# Patient Record
Sex: Female | Born: 1970 | Race: Black or African American | Hispanic: No | Marital: Married | Smoking: Current every day smoker
Health system: Southern US, Community
[De-identification: ages and names within clinical notes are randomized; demographics above are authoritative.]

---

## 1999-12-21 ENCOUNTER — Encounter: Admission: RE | Admit: 1999-12-21 | Discharge: 1999-12-21 | Payer: Self-pay | Admitting: Internal Medicine

## 2000-04-23 ENCOUNTER — Encounter: Payer: Self-pay | Admitting: Emergency Medicine

## 2000-04-23 ENCOUNTER — Emergency Department (HOSPITAL_COMMUNITY): Admission: EM | Admit: 2000-04-23 | Discharge: 2000-04-23 | Payer: Self-pay | Admitting: Emergency Medicine

## 2001-04-24 ENCOUNTER — Emergency Department (HOSPITAL_COMMUNITY): Admission: EM | Admit: 2001-04-24 | Discharge: 2001-04-24 | Payer: Self-pay | Admitting: Emergency Medicine

## 2001-04-29 ENCOUNTER — Encounter: Admission: RE | Admit: 2001-04-29 | Discharge: 2001-04-29 | Payer: Self-pay | Admitting: Obstetrics & Gynecology

## 2001-04-29 ENCOUNTER — Encounter (INDEPENDENT_AMBULATORY_CARE_PROVIDER_SITE_OTHER): Payer: Self-pay | Admitting: *Deleted

## 2001-05-14 ENCOUNTER — Emergency Department (HOSPITAL_COMMUNITY): Admission: EM | Admit: 2001-05-14 | Discharge: 2001-05-15 | Payer: Self-pay | Admitting: *Deleted

## 2001-07-13 ENCOUNTER — Inpatient Hospital Stay (HOSPITAL_COMMUNITY): Admission: AD | Admit: 2001-07-13 | Discharge: 2001-07-13 | Payer: Self-pay | Admitting: *Deleted

## 2001-07-14 ENCOUNTER — Encounter: Payer: Self-pay | Admitting: Obstetrics

## 2001-07-14 ENCOUNTER — Inpatient Hospital Stay (HOSPITAL_COMMUNITY): Admission: RE | Admit: 2001-07-14 | Discharge: 2001-07-14 | Payer: Self-pay | Admitting: Obstetrics

## 2001-10-07 ENCOUNTER — Inpatient Hospital Stay (HOSPITAL_COMMUNITY): Admission: AD | Admit: 2001-10-07 | Discharge: 2001-10-10 | Payer: Self-pay | Admitting: *Deleted

## 2001-10-07 ENCOUNTER — Encounter: Payer: Self-pay | Admitting: *Deleted

## 2001-11-16 ENCOUNTER — Encounter (INDEPENDENT_AMBULATORY_CARE_PROVIDER_SITE_OTHER): Payer: Self-pay | Admitting: Specialist

## 2001-11-16 ENCOUNTER — Inpatient Hospital Stay (HOSPITAL_COMMUNITY): Admission: AD | Admit: 2001-11-16 | Discharge: 2001-11-18 | Payer: Self-pay | Admitting: *Deleted

## 2002-01-09 ENCOUNTER — Emergency Department (HOSPITAL_COMMUNITY): Admission: EM | Admit: 2002-01-09 | Discharge: 2002-01-10 | Payer: Self-pay | Admitting: Emergency Medicine

## 2002-01-10 ENCOUNTER — Encounter: Payer: Self-pay | Admitting: Emergency Medicine

## 2003-03-03 ENCOUNTER — Encounter: Payer: Self-pay | Admitting: Emergency Medicine

## 2003-03-03 ENCOUNTER — Emergency Department (HOSPITAL_COMMUNITY): Admission: EM | Admit: 2003-03-03 | Discharge: 2003-03-03 | Payer: Self-pay | Admitting: Emergency Medicine

## 2010-01-22 ENCOUNTER — Emergency Department (HOSPITAL_COMMUNITY): Admission: EM | Admit: 2010-01-22 | Discharge: 2010-01-22 | Payer: Self-pay | Admitting: Emergency Medicine

## 2010-10-01 LAB — URINALYSIS, ROUTINE W REFLEX MICROSCOPIC
Bilirubin Urine: NEGATIVE
Glucose, UA: NEGATIVE mg/dL
Ketones, ur: NEGATIVE mg/dL
Nitrite: NEGATIVE
Protein, ur: NEGATIVE mg/dL
Specific Gravity, Urine: 1.013 (ref 1.005–1.030)
Urobilinogen, UA: 0.2 mg/dL (ref 0.0–1.0)
pH: 5.5 (ref 5.0–8.0)

## 2010-10-01 LAB — URINE MICROSCOPIC-ADD ON

## 2010-12-01 NOTE — Op Note (Signed)
[facility]  Patient:    Jessica Golden, Jessica Golden Visit Number: [id number] MRN: [id number]          Service Type: OBS Location: [id number] [id number] Attending Physician:  Michaelle Copas Dictated by:   Clement Husbands, M.D. Proc. Date: 11/17/01 Admit Date:  11/16/2001 Discharge Date: 11/18/2001                             Operative Report  PREOPERATIVE DIAGNOSIS:       Requested sterilization.  POSTOPERATIVE DIAGNOSIS:      Requested sterilization.  OPERATION:                    Postpartum bilateral tubal ligation with                               partial salpingectomies.  SURGEON:                      Clement Husbands, M.D.  ASSISTANT:                    Argentina Donovan, M.D.  ANESTHESIA:                   Epidural.  DESCRIPTION OF PROCEDURE:     Patient under satisfactory epidural anesthesia, supine position.  The abdomen was prepped and draped.  A small transverse infraumbilical skin incision was made.  It was carried down to the _____ fascia, which was transversely divided, and the peritoneal cavity was entered.  The fundal portion of the uterus as well as both fallopian tubes and ovaries were inspected and were normal.  Both tubes were then doubly ligated in their midportion with 0 plain catgut and the ligated segments excised.  The tubal "stumps" were coagulated.  Hemostasis was fine.  Specimens were sent for both the right and left side.  The anterior peritoneum was closed with a running 0 Vicryl suture, _____ fascia was closed with running 0 Vicryl suture.  Skin edges were approximated with a subcuticular 4-0 nylon suture.  Blood loss was negligible.  Sponge count was correct.  Marcaine 0.5% with epinephrine had been injected in the site prior to the opening incision.  The patient tolerated the procedure well, was returned to the recovery room in satisfactory condition. Dictated by:   Clement Husbands, M.D. Attending  Physician:  Michaelle Copas DD:  11/17/01 TD:  11/18/01 Job: [id number]

## 2015-02-16 ENCOUNTER — Encounter (HOSPITAL_COMMUNITY): Payer: Self-pay

## 2015-02-16 ENCOUNTER — Emergency Department (HOSPITAL_COMMUNITY)
Admission: EM | Admit: 2015-02-16 | Discharge: 2015-02-17 | Disposition: A | Discharge status: Home / Self Care | Payer: Self-pay | Attending: Emergency Medicine | Admitting: Emergency Medicine

## 2015-02-16 DIAGNOSIS — Z720 Tobacco use: Secondary | ICD-10-CM | POA: Insufficient documentation

## 2015-02-16 DIAGNOSIS — L259 Unspecified contact dermatitis, unspecified cause: Secondary | ICD-10-CM | POA: Insufficient documentation

## 2015-02-16 DIAGNOSIS — H9209 Otalgia, unspecified ear: Secondary | ICD-10-CM | POA: Insufficient documentation

## 2015-02-16 NOTE — ED Provider Notes (Signed)
CSN: [id number]     Arrival date & time 02/16/15  2250 History   This chart was scribed for Blake Divine, MD by Arlan Organ, ED Scribe. This patient was seen in room A01C/A01C and the patient's care was started 11:47 PM.   Chief Complaint  Patient presents with  . Rash   The history is provided by the patient. No language interpreter was used.    HPI Comments: Jessica Golden is a 44 y.o. female without any pertinent past medical history who presents to the Emergency Department complaining of a pruritis rash to the hair line x 3-4 days. Pt also reports swelling to a lymph node on the L side of her throat along with mild "drainage" from hairline and L sided otalgia. Ms. Soltero noted symptoms after using hair jam and Karenique in her hair. Last use of products 2-3 days ago. Water and Vinegar combination attempted to rash without any improvement for symptoms. Hair has also been washed several times since time of onset. No OTC medications attempted prior to arrival. No recent fever or chills. No known allergies to medications.  History reviewed. No pertinent past medical history. History reviewed. No pertinent past surgical history. No family history on file. History  Substance Use Topics  . Smoking status: Current Every Day Smoker  . Smokeless tobacco: Not on file  . Alcohol Use: No   OB History    No data available     Review of Systems  Constitutional: Negative for fever and chills.  HENT: Positive for ear pain. Negative for congestion, rhinorrhea and sore throat.   Respiratory: Negative for shortness of breath.   Cardiovascular: Negative for chest pain.  Gastrointestinal: Negative for nausea and vomiting.  Skin: Positive for rash.  Psychiatric/Behavioral: Negative for confusion.  All other systems reviewed and are negative.     Allergies  Review of patient's allergies indicates no known allergies.  Home Medications   Prior to Admission medications   Not on File   Triage  Vitals: BP 119/58 mmHg  Pulse 72  Resp 18  Ht  (1.727 m)  Wt 148 lb 9 oz (67.388 kg)  BMI 22.59 kg/m2  SpO2 100%  LMP 02/08/2015   Physical Exam  Constitutional: She is oriented to person, place, and time. She appears well-developed and well-nourished. No distress.  HENT:  Head: Normocephalic and atraumatic.    Eyes: Conjunctivae are normal. No scleral icterus.  Neck: Neck supple.  Cardiovascular: Normal rate and intact distal pulses.   Pulmonary/Chest: Effort normal. No stridor. No respiratory distress.  Abdominal: Normal appearance. She exhibits no distension.  Neurological: She is alert and oriented to person, place, and time.  Skin: Skin is warm and dry. No rash noted.  Scaly rash within anterior hairline and tops of ears.  No redness. No tenderness.  No masses.    Psychiatric: She has a normal mood and affect. Her behavior is normal.  Nursing note and vitals reviewed.   ED Course  Procedures (including critical care time)  DIAGNOSTIC STUDIES: Oxygen Saturation is 100% on RA, Normal by my interpretation.    COORDINATION OF CARE: 11:52 PM- Will send home with a prescription for Prednisone. Discussed treatment plan with pt at bedside and pt agreed to plan.     Labs Review Labs Reviewed - No data to display  Imaging Review No results found.   EKG Interpretation None      MDM   Final diagnoses:  Contact dermatitis    Contact  dermatitis after exposure to new hair products.  Does not appear to be bacterial or fungal infection.  Will treat with course of steroids.    I personally performed the services described in this documentation, which was scribed in my presence. The recorded information has been reviewed and is accurate.     Blake Divine, MD 02/17/15 [phone]

## 2015-02-16 NOTE — ED Notes (Signed)
Pt states she has been using jam and keranique for her hair for the past 3-4 days and hairline in the front is sticky. She has been using vinegar and water but it is still sticky. Also reports a swollen lymph node to the left throat also for the past few days. Tender with palpation. Noticed she has had some bumps to her right forearm and antecubital area that are itching also for the past month.

## 2015-02-17 MED ORDER — PREDNISONE 10 MG PO TABS: 50.0000 mg | ORAL_TABLET | Freq: Every day | ORAL | Status: DC

## 2015-02-17 MED ORDER — PREDNISONE 20 MG PO TABS
50.0000 mg | ORAL_TABLET | Freq: Once | ORAL | Status: AC
  Administered 2015-02-17: 50 mg via ORAL
  Filled 2015-02-17: qty 3

## 2015-02-17 NOTE — Discharge Instructions (Signed)

## 2016-07-01 ENCOUNTER — Emergency Department (HOSPITAL_COMMUNITY): Payer: Medicaid Other

## 2016-07-01 ENCOUNTER — Encounter (HOSPITAL_COMMUNITY): Payer: Self-pay | Admitting: Emergency Medicine

## 2016-07-01 ENCOUNTER — Emergency Department (HOSPITAL_COMMUNITY)
Admission: EM | Admit: 2016-07-01 | Discharge: 2016-07-01 | Disposition: A | Discharge status: Home / Self Care | Payer: Medicaid Other | Attending: Emergency Medicine | Admitting: Emergency Medicine

## 2016-07-01 DIAGNOSIS — R519 Headache, unspecified: Secondary | ICD-10-CM

## 2016-07-01 DIAGNOSIS — R51 Headache: Secondary | ICD-10-CM | POA: Diagnosis present

## 2016-07-01 DIAGNOSIS — F17200 Nicotine dependence, unspecified, uncomplicated: Secondary | ICD-10-CM | POA: Diagnosis not present

## 2016-07-01 DIAGNOSIS — R05 Cough: Secondary | ICD-10-CM | POA: Diagnosis not present

## 2016-07-01 DIAGNOSIS — R059 Cough, unspecified: Secondary | ICD-10-CM

## 2016-07-01 LAB — CBC WITH DIFFERENTIAL/PLATELET
BASOS ABS: 0 10*3/uL (ref 0.0–0.1)
Basophils Relative: 0 %
EOS ABS: 0.1 10*3/uL (ref 0.0–0.7)
EOS PCT: 1 %
HCT: 35.6 % — ABNORMAL LOW (ref 36.0–46.0)
Hemoglobin: 12 g/dL (ref 12.0–15.0)
Lymphocytes Relative: 39 %
Lymphs Abs: 2.8 10*3/uL (ref 0.7–4.0)
MCH: 29.9 pg (ref 26.0–34.0)
MCHC: 33.7 g/dL (ref 30.0–36.0)
MCV: 88.6 fL (ref 78.0–100.0)
Monocytes Absolute: 0.6 10*3/uL (ref 0.1–1.0)
Monocytes Relative: 8 %
Neutro Abs: 3.8 10*3/uL (ref 1.7–7.7)
Neutrophils Relative %: 52 %
PLATELETS: 292 10*3/uL (ref 150–400)
RBC: 4.02 MIL/uL (ref 3.87–5.11)
RDW: 13.1 % (ref 11.5–15.5)
WBC: 7.3 10*3/uL (ref 4.0–10.5)

## 2016-07-01 LAB — BASIC METABOLIC PANEL
Anion gap: 5 (ref 5–15)
BUN: 6 mg/dL (ref 6–20)
CO2: 23 mmol/L (ref 22–32)
CREATININE: 0.72 mg/dL (ref 0.44–1.00)
Calcium: 8.9 mg/dL (ref 8.9–10.3)
Chloride: 110 mmol/L (ref 101–111)
GFR calc Af Amer: >60 mL/min (ref >60)
Glucose, Bld: 88 mg/dL (ref 65–99)
POTASSIUM: 3.8 mmol/L (ref 3.5–5.1)
SODIUM: 138 mmol/L (ref 135–145)

## 2016-07-01 LAB — RAPID STREP SCREEN (MED CTR MEBANE ONLY): STREPTOCOCCUS, GROUP A SCREEN (DIRECT): NEGATIVE

## 2016-07-01 MED ORDER — METOCLOPRAMIDE HCL 5 MG/ML IJ SOLN
10.0000 mg | Freq: Once | INTRAMUSCULAR | Status: AC
  Administered 2016-07-01: 10 mg via INTRAVENOUS
  Filled 2016-07-01: qty 2

## 2016-07-01 MED ORDER — BENZONATATE 100 MG PO CAPS: 100.0000 mg | ORAL_CAPSULE | Freq: Three times a day (TID) | ORAL | 0 refills | Status: DC

## 2016-07-01 MED ORDER — DEXAMETHASONE SODIUM PHOSPHATE 10 MG/ML IJ SOLN
10.0000 mg | Freq: Once | INTRAMUSCULAR | Status: AC
  Administered 2016-07-01: 10 mg via INTRAVENOUS
  Filled 2016-07-01: qty 1

## 2016-07-01 MED ORDER — SODIUM CHLORIDE 0.9 % IV BOLUS (SEPSIS)
1000.0000 mL | Freq: Once | INTRAVENOUS | Status: AC
  Administered 2016-07-01: 1000 mL via INTRAVENOUS

## 2016-07-01 MED ORDER — KETOROLAC TROMETHAMINE 30 MG/ML IJ SOLN
30.0000 mg | Freq: Once | INTRAMUSCULAR | Status: AC
  Administered 2016-07-01: 30 mg via INTRAVENOUS
  Filled 2016-07-01: qty 1

## 2016-07-01 MED ORDER — BENZONATATE 100 MG PO CAPS
100.0000 mg | ORAL_CAPSULE | Freq: Once | ORAL | Status: AC
  Administered 2016-07-01: 100 mg via ORAL
  Filled 2016-07-01: qty 1

## 2016-07-01 MED ORDER — DIPHENHYDRAMINE HCL 50 MG/ML IJ SOLN
25.0000 mg | Freq: Once | INTRAMUSCULAR | Status: AC
  Administered 2016-07-01: 25 mg via INTRAVENOUS
  Filled 2016-07-01: qty 1

## 2016-07-01 NOTE — Discharge Instructions (Signed)
[facility] appointment [facility] if the lymph node on the left side is not getting better within the next month Avoid smoking/cut back as much as possible Return to the ED for worsening symptoms

## 2016-07-01 NOTE — ED Provider Notes (Signed)
[facility]-EMERGENCY DEPT Provider Note   CSN: [id number] Arrival date & time: 07/01/16  1118     History   Chief Complaint Chief Complaint  Patient presents with  . Cough  . Headache    HPI Jessica Golden is a 45 y.o. female who presents with a headache and cough. No significant PMH. She states she has had a cough for the past 3 weeks. She states she had URI symptoms 3 weeks ago which have resolved and now has had a cough which is productive at times. She also notes a "lump" on the right side of her face. Denies pain at that site.  She also started to have a headache several days ago. She states she has a history of tension headaches and this is similar to ones she has had in the past. The main difference it has lasted longer than previous headaches. She denies fever, chills, syncope, vision changes, ear pain, congestion, sore throat, chest pain, SOB, wheezing, abdominal pain, N/V/D/, dysuria. She is a current smoker.   HPI  History reviewed. No pertinent past medical history.  There are no active problems to display for this patient.   History reviewed. No pertinent surgical history.  OB History    No data available       Home Medications    Prior to Admission medications   Medication Sig Start Date End Date Taking? Authorizing Provider  predniSONE (DELTASONE) 10 MG tablet Take 5 tablets (50 mg total) by mouth daily. 02/17/15   Blake DivineJohn Wofford, MD    Family History History reviewed. No pertinent family history.  Social History Social History  Substance Use Topics  . Smoking status: Current Every Day Smoker  . Smokeless tobacco: Never Used  . Alcohol use No     Allergies   Patient has no known allergies.   Review of Systems Review of Systems  Constitutional: Negative for chills and fever.  HENT: Negative for congestion, ear pain, postnasal drip, rhinorrhea, sinus pressure and sore throat.        +Lymphadenopathy  Eyes: Negative for visual disturbance.    Respiratory: Positive for cough. Negative for shortness of breath and wheezing.   Cardiovascular: Negative for chest pain.  Gastrointestinal: Negative for abdominal pain, nausea and vomiting.  Genitourinary: Negative for dysuria.  Allergic/Immunologic: Negative for immunocompromised state.  Neurological: Positive for headaches. Negative for dizziness, syncope and light-headedness.  All other systems reviewed and are negative.    Physical Exam Updated Vital Signs BP 110/69   Pulse 71   Temp 99.2 F (37.3 C) (Oral)   Resp 18   SpO2 100%   Physical Exam  Constitutional: She is oriented to person, place, and time. She appears well-developed and well-nourished. No distress.  HENT:  Head: Normocephalic and atraumatic.  Right Ear: Hearing, tympanic membrane, external ear and ear canal normal.  Left Ear: Hearing, tympanic membrane, external ear and ear canal normal.  Nose: Nose normal. Right sinus exhibits no maxillary sinus tenderness and no frontal sinus tenderness. Left sinus exhibits no maxillary sinus tenderness and no frontal sinus tenderness.  Mouth/Throat: Uvula is midline, oropharynx is clear and moist and mucous membranes are normal. No dental caries.  Eyes: Conjunctivae are normal. Pupils are equal, round, and reactive to light. Right eye exhibits no discharge. Left eye exhibits no discharge. No scleral icterus.  Neck: Normal range of motion.  Cardiovascular: Normal rate and regular rhythm.  Exam reveals no gallop and no friction rub.   No murmur heard. Pulmonary/Chest:  Effort normal and breath sounds normal. No respiratory distress. She has no wheezes. She has no rales. She exhibits no tenderness.  Abdominal: She exhibits no distension.  Lymphadenopathy:    She has cervical adenopathy (Left tonsillar adenopathy).  Neurological: She is alert and oriented to person, place, and time.  Lying on stretcher in NAD. GCS 15. Speaks in a clear voice. Cranial nerves II through XII  grossly intact. 5/5 strength in all extremities. Sensation fully intact.  Bilateral finger-to-nose intact. Normal gait   Skin: Skin is warm and dry.  Psychiatric: She has a normal mood and affect. Her behavior is normal.  Nursing note and vitals reviewed.    ED Treatments / Results  Labs (all labs ordered are listed, but only abnormal results are displayed) Labs Reviewed  CBC WITH DIFFERENTIAL/PLATELET - Abnormal; Notable for the following:       Result Value   HCT 35.6 (*)    All other components within normal limits  RAPID STREP SCREEN (NOT AT [facility])  CULTURE, GROUP A STREP [facility])  BASIC METABOLIC PANEL    EKG  EKG Interpretation None       Radiology Dg Chest 2 View  Result Date: 07/01/2016 CLINICAL DATA:  Cough and congestion EXAM: CHEST  2 VIEW COMPARISON:  None. FINDINGS: No pneumothorax. The heart, hila, and mediastinum are normal. Calcified nodes are seen in the right hilum and mediastinum. No nodules or masses. No focal infiltrates. IMPRESSION: No active cardiopulmonary disease. Electronically Signed   By: Gerome Samavid  Williams III M.D   On: 07/01/2016 12:49    Procedures Procedures (including critical care time)  Medications Ordered in ED Medications  ketorolac (TORADOL) 30 MG/ML injection 30 mg (30 mg Intravenous Given 07/01/16 1350)  sodium chloride 0.9 % bolus 1,000 mL (0 mLs Intravenous Stopped 07/01/16 1543)  metoCLOPramide (REGLAN) injection 10 mg (10 mg Intravenous Given 07/01/16 1351)  diphenhydrAMINE (BENADRYL) injection 25 mg (25 mg Intravenous Given 07/01/16 1352)  dexamethasone (DECADRON) injection 10 mg (10 mg Intravenous Given 07/01/16 1354)  benzonatate (TESSALON) capsule 100 mg (100 mg Oral Given 07/01/16 1346)     Initial Impression / Assessment and Plan / ED Course  I have reviewed the triage vital signs and the nursing notes.  Pertinent labs & imaging results that were available during my care of the patient were reviewed by me and considered  in my medical decision making (see chart for details).  Clinical Course    45 year old female with residual cough from URI and headache. Patient is afebrile, not tachycardic or tachypneic, normotensive, and not hypoxic. CXR negative. Headache is mild in nature and typical for her usual headaches. Migraine cocktail given. Tessalon given.  On recheck, patient reports symptomatic improvement. Will d/c with Tessalon and advised smoking cessation. Patient is NAD, non-toxic, with stable VS. Patient is informed of clinical course, understands medical decision making process, and agrees with plan. Opportunity for questions provided and all questions answered. Return precautions given.   Final Clinical Impressions(s) / ED Diagnoses   Final diagnoses:  Nonintractable headache, unspecified chronicity pattern, unspecified headache type  Cough    New Prescriptions Discharge Medication List as of 07/01/2016  2:54 PM    START taking these medications   Details  benzonatate (TESSALON) 100 MG capsule Take 1 capsule (100 mg total) by mouth every 8 (eight) hours., Starting Sun 07/01/2016, Print         Bethel BornKelly Marie Rosalene Wardrop, PA-C 07/09/16 1149    Margarita Grizzleanielle Ray, MD 07/11/16 [phone]

## 2016-07-01 NOTE — ED Triage Notes (Signed)
Pt sts HA and cough; pt sts pain with cough and movement in chest

## 2016-07-03 LAB — CULTURE, GROUP A STREP (THRC)

## 2016-08-30 ENCOUNTER — Encounter (HOSPITAL_COMMUNITY): Payer: Self-pay | Admitting: *Deleted

## 2016-08-30 ENCOUNTER — Ambulatory Visit (HOSPITAL_COMMUNITY)
Admission: EM | Admit: 2016-08-30 | Discharge: 2016-08-30 | Disposition: A | Discharge status: Home / Self Care | Payer: Medicaid Other | Attending: Internal Medicine | Admitting: Internal Medicine

## 2016-08-30 DIAGNOSIS — S161XXA Strain of muscle, fascia and tendon at neck level, initial encounter: Secondary | ICD-10-CM | POA: Diagnosis not present

## 2016-08-30 DIAGNOSIS — N3001 Acute cystitis with hematuria: Secondary | ICD-10-CM

## 2016-08-30 LAB — POCT URINALYSIS DIP (DEVICE)
Glucose, UA: NEGATIVE mg/dL
Ketones, ur: NEGATIVE mg/dL
Nitrite: NEGATIVE
Protein, ur: >=300 mg/dL — AB
UROBILINOGEN UA: 0.2 mg/dL (ref 0.0–1.0)
pH: 5.5 (ref 5.0–8.0)

## 2016-08-30 MED ORDER — CEPHALEXIN 500 MG PO CAPS: 500.0000 mg | ORAL_CAPSULE | Freq: Four times a day (QID) | ORAL | 0 refills | Status: DC

## 2016-08-30 MED ORDER — FLUCONAZOLE 150 MG PO TABS: ORAL_TABLET | ORAL | 0 refills | Status: DC

## 2016-08-30 NOTE — ED Provider Notes (Signed)
CSN: [id number]     Arrival date & time 08/30/16  1518 History   First MD Initiated Contact with Patient 08/30/16 1731     Chief Complaint  Patient presents with  . Polyuria   (Consider location/radiation/quality/duration/timing/severity/associated sxs/prior Treatment) 46 year old female presents with frequent urination, urinary urgency and a sense of needing to 4 week immediately after voiding. Sometimes the amount of voiding is a small volume. Also with gross hematuria. Second complaint is that of waking up recently with sore muscles in the right neck. She states "I have got a crick in my neck". No known injury. Has pain when  she turns her head to the right. No spinal pain or tenderness.      History reviewed. No pertinent past medical history. History reviewed. No pertinent surgical history. History reviewed. No pertinent family history. Social History  Substance Use Topics  . Smoking status: Current Every Day Smoker  . Smokeless tobacco: Never Used  . Alcohol use No   OB History    No data available     Review of Systems  Constitutional: Negative for activity change, chills and fever.  HENT: Negative.   Respiratory: Negative.   Cardiovascular: Negative.   Genitourinary: Positive for dysuria, frequency and urgency. Negative for pelvic pain and vaginal discharge.  Musculoskeletal: Positive for myalgias and neck pain.       As per HPI  Skin: Negative for color change, pallor and rash.  Neurological: Negative.     Allergies  Patient has no known allergies.  Home Medications   Prior to Admission medications   Medication Sig Start Date End Date Taking? Authorizing Provider  benzonatate (TESSALON) 100 MG capsule Take 1 capsule (100 mg total) by mouth every 8 (eight) hours. 07/01/16   Bethel BornKelly Marie Gekas, PA-C  cephALEXin (KEFLEX) 500 MG capsule Take 1 capsule (500 mg total) by mouth 4 (four) times daily. 08/30/16   Hayden Rasmussenavid Inette Doubrava, NP  fluconazole (DIFLUCAN) 150 MG tablet 1 tab  po x 1. May repeat in 72 hours if no improvement 08/30/16   Hayden Rasmussenavid Michalle Rademaker, NP   Meds Ordered and Administered this Visit  Medications - No data to display  BP 113/71 (BP Location: Right Arm)   Pulse 77   Temp 98.4 F (36.9 C) (Oral)   Resp 17   LMP 08/26/2016   SpO2 100%  No data found.   Physical Exam  Constitutional: She is oriented to person, place, and time. She appears well-developed and well-nourished. No distress.  Eyes: EOM are normal.  Neck: Neck supple.  Cardiovascular: Normal rate.   Pulmonary/Chest: Effort normal. No respiratory distress.  Musculoskeletal: She exhibits no edema.  Tenderness to the right paracervical and right trapezius muscle. Limited rotation to the right secondary to muscle pain. No spinal tenderness.  Neurological: She is alert and oriented to person, place, and time. She exhibits normal muscle tone.  Skin: Skin is warm and dry.  Psychiatric: She has a normal mood and affect.  Nursing note and vitals reviewed.   Urgent Care Course     Procedures (including critical care time)  Labs Review Labs Reviewed  POCT URINALYSIS DIP (DEVICE) - Abnormal; Notable for the following:       Result Value   Bilirubin Urine SMALL (*)    Hgb urine dipstick LARGE (*)    Protein, ur >=300 (*)    Leukocytes, UA TRACE (*)    All other components within normal limits    Imaging Review No results found.  Visual Acuity Review  Right Eye Distance:   Left Eye Distance:   Bilateral Distance:    Right Eye Near:   Left Eye Near:    Bilateral Near:         MDM   1. Acute cystitis with hematuria   2. Cervical muscle strain, initial encounter    Apply heat to the muscles of your neck and perform gentle stretches. Drink any fluids stay well-hydrated. Take the medicine as directed. Meds ordered this encounter  Medications  . cephALEXin (KEFLEX) 500 MG capsule    Sig: Take 1 capsule (500 mg total) by mouth 4 (four) times daily.    Dispense:  28  capsule    Refill:  0    Order Specific Question:   Supervising Provider    Answer:   Eustace Moore [[id number]]  . fluconazole (DIFLUCAN) 150 MG tablet    Sig: 1 tab po x 1. May repeat in 72 hours if no improvement    Dispense:  2 tablet    Refill:  0    Order Specific Question:   Supervising Provider    Answer:   Eustace Moore [[id number]]       Hayden Rasmussen, NP 08/30/16 1745

## 2016-08-30 NOTE — ED Triage Notes (Addendum)
Patient reports about 3 weeks ago she went to dentist-was prescribed amoxicillin-states acquired yeast infection after that. States now she has had 3 day history of polyuria. Denies dysuria. Denies lower back pain or abdomen pain.   Patient also reports right upper back pain since this morning after sleeping

## 2016-08-30 NOTE — Discharge Instructions (Signed)
Apply heat to the muscles of your neck and perform gentle stretches. Drink any fluids stay well-hydrated. Take the medicine as directed.

## 2017-11-07 ENCOUNTER — Ambulatory Visit: Payer: Self-pay | Admitting: Surgery

## 2017-11-18 ENCOUNTER — Ambulatory Visit: Payer: Self-pay | Admitting: Surgery

## 2017-11-18 NOTE — H&P (Signed)
istory of Present Illness Jessica Golden. Tennile Styles MD; 11/18/2017 9:37 AM) The patient is a 47 year old female who presents with a complaint of Mass. Referred by Ladora Daniel PA-C for abdominal wall mass, and left neck mass   This is a 47 year old female who presents with more than 10 years of a slowly enlarging mass on the left side of her abdomen. This has become quite large and is causing some discomfort. It is larger than a tennis ball. This area has never become infected. She would like to have it removed.  Over the last 6 months, the patient has developed a smaller mass on the left side of her neck just posterior to her ear. This has also cause some discomfort. She denies any facial numbness or drooping. She had ultrasounds of both of these areas. The abdominal ultrasound showed that this area likely represents a lipoma. However the left neck mass is a septated cystic lesion associated with the left parotid gland. This measures 3.8 x 3.0 x 2.7 cm. we referred her to ENT who aspirated this area with resolution of the mass. They're going to follow her without surgery at this time.     Problem List/Past Medical Molli Hazard K. 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 Marie Chow, MD; 11/18/2017 9:37 AM) No pertinent past surgical history  Diagnostic Studies History (Ashlen Kiger K. Tra Wilemon, MD; 11/18/2017 9:37 AM) Mammogram within last year Pap Smear >5 years ago  Allergies (Tanisha A. Manson Passey, RMA; 11/18/2017 9:11 AM) No Known Drug Allergies [10/09/2017]: Allergies Reconciled  Medication History (Tanisha A. Manson Passey, RMA; 11/18/2017 9:11 AM) No Current Medications Medications Reconciled  Social History Jessica Golden. Manu Rubey, MD; 11/18/2017 9:37 AM) Alcohol use Occasional alcohol use. Caffeine use Coffee. Illicit drug use Uses socially only. Tobacco use Current every  day smoker.  Family History Jessica Golden. Messiah Ahr, MD; 11/18/2017 9:37 AM) First Degree Relatives No pertinent family history  Pregnancy / Birth History Jessica Golden. Bettie Swavely, MD; 11/18/2017 9:37 AM) Age at menarche 14 years. Gravida 3 Irregular periods Length (months) of breastfeeding 3-6 Maternal age 59-20 Para 3  Other Problems Jessica Golden. Lloyd Cullinan, MD; 11/18/2017 9:37 AM) No pertinent past medical history    Vitals (Tanisha A. Brown RMA; 11/18/2017 9:11 AM) 11/18/2017 9:11 AM Weight: 174.4 lb Height: 68in Body Surface Area: 1.93 m Body Mass Index: 26.52 kg/m  Temp.: 97.39F  Pulse: 88 (Regular)  BP: 128/84 (Sitting, Left Arm, Standard)      Physical Exam Molli Hazard K. Ryleigh Buenger MD; 11/18/2017 9:37 AM)  The physical exam findings are as follows: Note:Well-developed well-nourished in no apparent distress  Eyes: Pupils equal, round; sclera anicteric HENT: Oral mucosa moist; good dentition Neck: left neck posterior to ear - firm, smooth 2 cm mass - protruding, no sign of infection; no thyromegaly Lungs: CTA bilaterally; normal respiratory effort CV: Regular rate and rhythm; no murmurs; extremities well-perfused with no edema Abd: +bowel sounds, soft, non-tender, no palpable organomegaly; protruding 7 cm subcutaneous mass on left side of abdomen - smooth, mobile, no skin changes Protruding umbilical hernia - enlarges with Valsalva maneuver Skin: Warm, dry; no sign of jaundice Psychiatric - alert and oriented x 4; calm mood and affect    Assessment & Plan Molli Hazard K. 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 The surgical procedure has been discussed with the patient. Potential risks, benefits, alternative treatments, and expected outcomes have been explained. All of the  patient's questions at this time have been answered. The likelihood of reaching the patient's treatment goal is good. The patient understand the proposed surgical procedure and wishes to proceed.  Jessica Golden. Corliss Skains, MD, [facility]  General/ Trauma Surgery  11/18/2017 9:38 AM

## 2017-11-26 ENCOUNTER — Other Ambulatory Visit: Payer: Self-pay | Admitting: Surgery

## 2017-12-02 IMAGING — CR DG CHEST 2V
2 series · 2 of 2 positions shown · non-contrast
Comparison: None.

CLINICAL DATA: Cough and congestion

EXAM:
CHEST  2 VIEW

[chest pa]
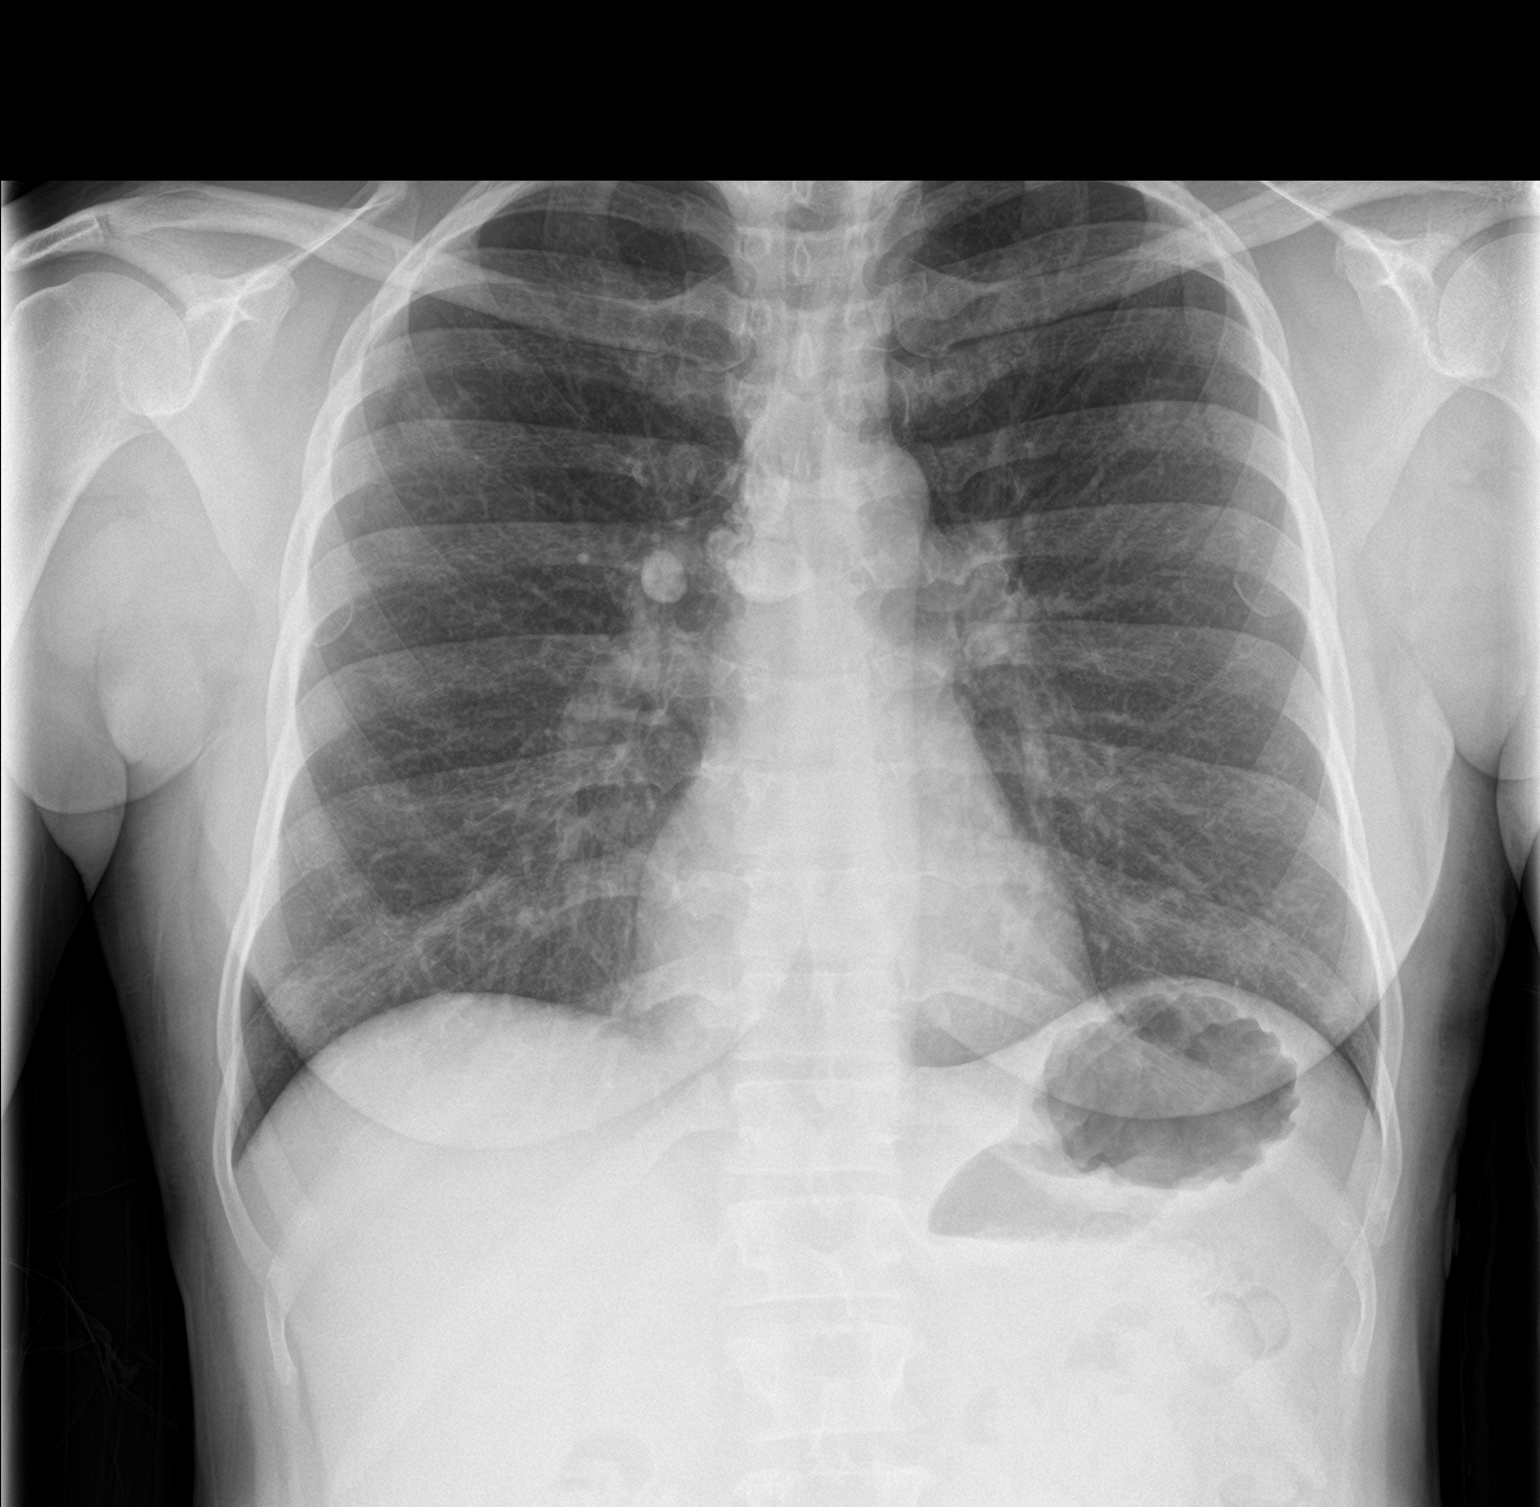

[chest lat]
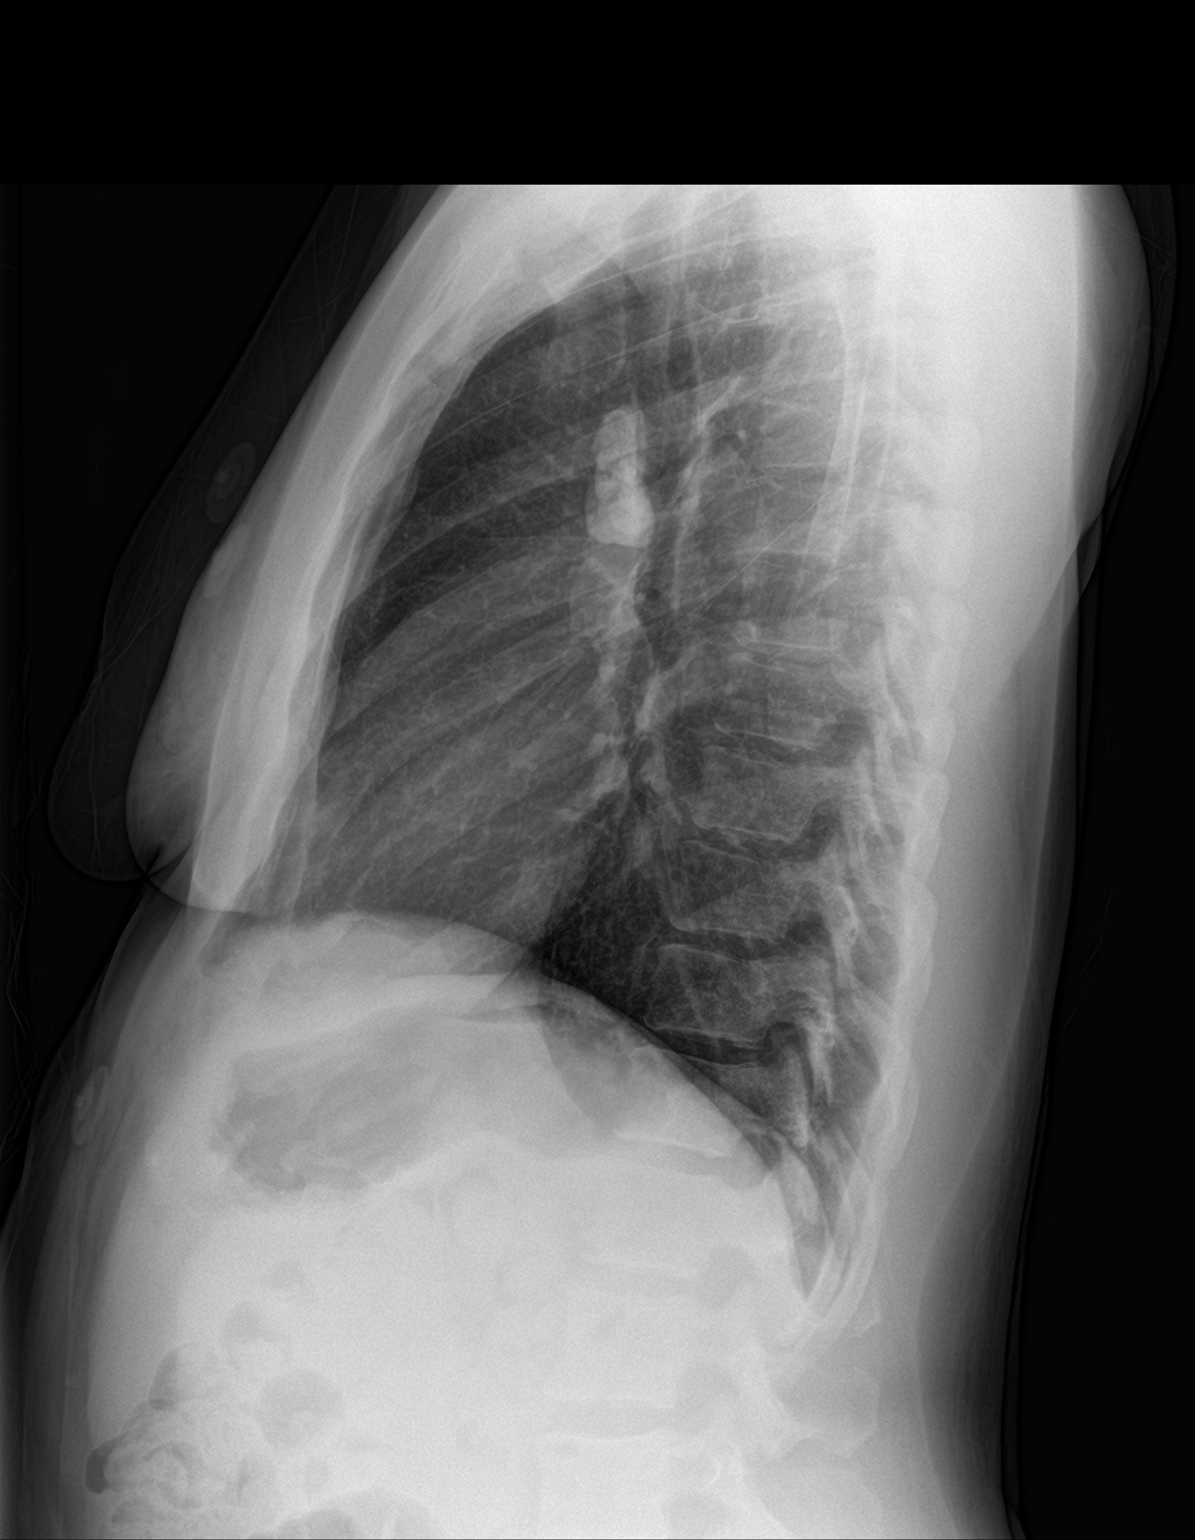

[2 of 2 positions shown; findings below may reference images not displayed]

FINDINGS: No pneumothorax. The heart, hila, and mediastinum are normal.
Calcified nodes are seen in the right hilum and mediastinum. No
nodules or masses. No focal infiltrates.
IMPRESSION: No active cardiopulmonary disease.

## 2020-05-08 ENCOUNTER — Emergency Department (HOSPITAL_COMMUNITY)
Admission: EM | Admit: 2020-05-08 | Discharge: 2020-05-08 | Disposition: A | Discharge status: Home / Self Care | Payer: No Typology Code available for payment source | Attending: Emergency Medicine | Admitting: Emergency Medicine

## 2020-05-08 ENCOUNTER — Other Ambulatory Visit: Payer: Self-pay

## 2020-05-08 ENCOUNTER — Encounter (HOSPITAL_COMMUNITY): Payer: Self-pay

## 2020-05-08 ENCOUNTER — Emergency Department (HOSPITAL_COMMUNITY): Payer: No Typology Code available for payment source

## 2020-05-08 DIAGNOSIS — W208XXA Other cause of strike by thrown, projected or falling object, initial encounter: Secondary | ICD-10-CM | POA: Diagnosis not present

## 2020-05-08 DIAGNOSIS — Y990 Civilian activity done for income or pay: Secondary | ICD-10-CM | POA: Insufficient documentation

## 2020-05-08 DIAGNOSIS — F17200 Nicotine dependence, unspecified, uncomplicated: Secondary | ICD-10-CM | POA: Diagnosis not present

## 2020-05-08 DIAGNOSIS — Y9289 Other specified places as the place of occurrence of the external cause: Secondary | ICD-10-CM | POA: Diagnosis not present

## 2020-05-08 DIAGNOSIS — M79672 Pain in left foot: Secondary | ICD-10-CM | POA: Insufficient documentation

## 2020-05-08 NOTE — ED Triage Notes (Signed)
Pt arrived via walk in, c/o left foot pain after dropping metal part on foot at work yesterday. Still able to ambulate and move toes, but painful.

## 2020-05-08 NOTE — Discharge Instructions (Addendum)
Take ibuprofen 3 times a day with meals.  Do not take other anti-inflammatories at the same time (Advil, Motrin, naproxen, Aleve). You may supplement with Tylenol if you need further pain control. Use ice packs or heating pads if this helps control your pain. Use crutches as needed for pain control.  Follow up with your primary care doctor, orthopedics, or the foot doctor as needed for further evaluation.  Return to the ER with any new, worsening, or concerning symptoms.

## 2020-05-08 NOTE — Progress Notes (Signed)
Orthopedic Tech Progress Note Patient Details:  Jessica Golden [birth year] [id number]  Ortho Devices Ortho Device/Splint Location: post op shoe LLE and crutches Ortho Device/Splint Interventions: Ordered, Application, Adjustment   Post Interventions Patient Tolerated: Well Instructions Provided: Care of device   Jennye Moccasin 05/08/2020, 1:21 PM

## 2020-05-08 NOTE — ED Provider Notes (Signed)
[facility]-EMERGENCY DEPT Provider Note   CSN: [id number] Arrival date & time: 05/08/20  1137     History Chief Complaint  Patient presents with  . Foot Injury    Jessica Golden is a 49 y.o. female presenting for evaluation of left foot pain.  Patient states yesterday she was at work when part of a car jack fell out of the packaging and fell onto her left foot.  She reports acute onset pain.  She elevated her foot and wrapped in an Ace wrap, but did not apply ice or take any medications.  She reports continued pain today.  No numbness or tingling.  No injury elsewhere.  She has no medical problems, takes medications daily.  Pain does not radiate.  Palpation and weightbearing makes it worse, nothing makes it better.  HPI     History reviewed. No pertinent past medical history.  There are no problems to display for this patient.   History reviewed. No pertinent surgical history.   OB History   No obstetric history on file.     History reviewed. No pertinent family history.  Social History   Tobacco Use  . Smoking status: Current Every Day Smoker  . Smokeless tobacco: Never Used  Substance Use Topics  . Alcohol use: No  . Drug use: No    Home Medications Prior to Admission medications   Medication Sig Start Date End Date Taking? Authorizing Provider  benzonatate (TESSALON) 100 MG capsule Take 1 capsule (100 mg total) by mouth every 8 (eight) hours. 07/01/16   Bethel Born, PA-C  cephALEXin (KEFLEX) 500 MG capsule Take 1 capsule (500 mg total) by mouth 4 (four) times daily. 08/30/16   Hayden Rasmussen, NP  fluconazole (DIFLUCAN) 150 MG tablet 1 tab po x 1. May repeat in 72 hours if no improvement 08/30/16   Hayden Rasmussen, NP    Allergies    Patient has no known allergies.  Review of Systems   Review of Systems  Musculoskeletal: Positive for arthralgias.  Neurological: Negative for numbness.    Physical Exam Updated Vital Signs BP 121/72  (BP Location: Left Arm)   Pulse 83   Temp 98.2 F (36.8 C) (Oral)   Resp 15   LMP 08/26/2016   SpO2 100%   Physical Exam Vitals and nursing note reviewed.  Constitutional:      General: She is not in acute distress.    Appearance: She is well-developed.  HENT:     Head: Normocephalic and atraumatic.  Pulmonary:     Effort: Pulmonary effort is normal.  Abdominal:     General: There is no distension.  Musculoskeletal:        General: Swelling and tenderness present. Normal range of motion.     Cervical back: Normal range of motion.     Comments: Mild swelling at the base of toes 2 through 4 of the left foot.  Tenderness palpation of the MTP and phalanxes in this location.  No tenderness palpation over the first tarsal, metatarsal, or toe.  Pedal pulse 2+ bilaterally.  Good distal sensation and cap refill of the left foot.  No pain of the ankle, Achilles, or calcaneus.  Skin:    General: Skin is warm.     Capillary Refill: Capillary refill takes less than 2 seconds.     Findings: No rash.  Neurological:     Mental Status: She is alert and oriented to person, place, and time.  ED Results / Procedures / Treatments   Labs (all labs ordered are listed, but only abnormal results are displayed) Labs Reviewed - No data to display  EKG None  Radiology DG Foot Complete Left  Result Date: 05/08/2020 CLINICAL DATA:  Pt reported left foot pain that radiates into all of her toes after dropping a car part on it yesterday at work.l foot pn EXAM: LEFT FOOT - COMPLETE 3+ VIEW COMPARISON:  None. FINDINGS: No fracture or dislocation of mid foot or forefoot. The phalanges are normal. The calcaneus is normal. No soft tissue abnormality. IMPRESSION: No fracture or dislocation. Electronically Signed   By: Genevive Bi M.D.   On: 05/08/2020 12:56    Procedures Procedures (including critical care time)  Medications Ordered in ED Medications - No data to display  ED Course  I have  reviewed the triage vital signs and the nursing notes.  Pertinent labs & imaging results that were available during my care of the patient were reviewed by me and considered in my medical decision making (see chart for details).    MDM Rules/Calculators/A&P                          Patient presenting for evaluation of left foot pain.  On exam, patient appears neurovascularly intact.  No obvious deformity.  However considering acute injury, will obtain x-rays.  X-ray viewed interpreted by me, no obvious fracture dislocation.  Discussed findings with patient.  Discussed Intermatic treatment Tylenol, ibuprofen, rest, ice, elevation.  Crutches and postop shoe given as needed for symptom control.  Encourage follow-up with orthopedics, podiatry, or primary care as needed.  At this time, patient appears safe for discharge.  Return precautions given.  Patient states she understands and agrees to plan.  Final Clinical Impression(s) / ED Diagnoses Final diagnoses:  Left foot pain    Rx / DC Orders ED Discharge Orders    None       Alveria Apley, PA-C 05/08/20 1401    Mancel Bale, MD 05/08/20 1415

## 2021-09-03 ENCOUNTER — Other Ambulatory Visit: Payer: Self-pay

## 2021-09-03 ENCOUNTER — Encounter (HOSPITAL_COMMUNITY): Payer: Self-pay

## 2021-09-03 ENCOUNTER — Ambulatory Visit (HOSPITAL_COMMUNITY)
Admission: EM | Admit: 2021-09-03 | Discharge: 2021-09-03 | Disposition: A | Discharge status: Home / Self Care | Payer: Medicaid Other | Attending: Urgent Care | Admitting: Urgent Care

## 2021-09-03 DIAGNOSIS — B351 Tinea unguium: Secondary | ICD-10-CM | POA: Diagnosis not present

## 2021-09-03 DIAGNOSIS — L301 Dyshidrosis [pompholyx]: Secondary | ICD-10-CM | POA: Diagnosis not present

## 2021-09-03 MED ORDER — TRIAMCINOLONE ACETONIDE 0.5 % EX OINT: 1.0000 "application " | TOPICAL_OINTMENT | Freq: Two times a day (BID) | CUTANEOUS | 0 refills | Status: DC

## 2021-09-03 NOTE — Discharge Instructions (Addendum)
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 This can be due to excessive handwashing or glove wearing. Please try to limit both if possible. Use the topical triamcinolone ointment twice daily.  Do not occlude and do not exceed greater than 14 days of use. Regarding your toenail, you can purchase over-the-counter ciclopirox which is an antifungal nail treatment.

## 2021-09-03 NOTE — ED Provider Notes (Signed)
[facility]    CSN: [id number] Arrival date & time: 09/03/21  1342      History   Chief Complaint Chief Complaint  Patient presents with   Rash    HPI Jessica Golden is a 51 y.o. female.   Pleasant 51 year old female presents today due to concerns of a rash on her bilateral hands x1 month.  She works for [employer] and states she wears gloves and washes her hands frequently.  She has been trying over-the-counter hydrocortisone, Monistat, Vaseline, and O'Keefe's working hands without any resolution to her symptoms.  She states the bumps are extremely itchy and the palms are occasionally painful.  She denies any rash anywhere Golden. She also is concerned with the appearance of her right great toe.  She states that a while back part of her nail fell off.  She is questioning if any treatment is needed.  She denies any pain to the toe.   Rash  History reviewed. No pertinent past medical history.  There are no problems to display for this patient.   History reviewed. No pertinent surgical history.  OB History   No obstetric history on file.      Home Medications    Prior to Admission medications   Medication Sig Start Date End Date Taking? Authorizing Provider  triamcinolone ointment (KENALOG) 0.5 % Apply 1 application topically 2 (two) times daily. 09/03/21  Yes Syanna Remmert, Jodelle Gross, PA    Family History Family History  Family history unknown: Yes    Social History Social History   Tobacco Use   Smoking status: Every Day   Smokeless tobacco: Never  Substance Use Topics   Alcohol use: No   Drug use: No     Allergies   Patient has no known allergies.   Review of Systems Review of Systems  Skin:  Positive for rash.  All other systems reviewed and are negative.   Physical Exam Triage Vital Signs ED Triage Vitals  Enc Vitals Group     BP 09/03/21 1520 109/74     Pulse Rate 09/03/21 1520 78     Resp 09/03/21 1520 18     Temp 09/03/21  1520 98 F (36.7 C)     Temp Source 09/03/21 1520 Oral     SpO2 09/03/21 1520 100 %     Weight --      Height --      Head Circumference --      Peak Flow --      Pain Score 09/03/21 1521 0     Pain Loc --      Pain Edu? --      Excl. in [place]? --    No data found.  Updated Vital Signs BP 109/74 (BP Location: Right Arm)    Pulse 78    Temp 98 F (36.7 C) (Oral)    Resp 18    LMP 08/26/2016    SpO2 100%   Visual Acuity Right Eye Distance:   Left Eye Distance:   Bilateral Distance:    Right Eye Near:   Left Eye Near:    Bilateral Near:     Physical Exam Vitals and nursing note reviewed.  Constitutional:      General: She is not in acute distress.    Appearance: Normal appearance. She is well-developed and normal weight. She is not ill-appearing or toxic-appearing.  HENT:     Head: Normocephalic and atraumatic.     Nose: Nose normal.  Mouth/Throat:     Mouth: Mucous membranes are moist.  Eyes:     Extraocular Movements: Extraocular movements intact.     Conjunctiva/sclera: Conjunctivae normal.     Pupils: Pupils are equal, round, and reactive to light.  Cardiovascular:     Rate and Rhythm: Normal rate and regular rhythm.     Heart sounds: No murmur heard. Pulmonary:     Effort: Pulmonary effort is normal. No respiratory distress.     Breath sounds: Normal breath sounds.  Abdominal:     Palpations: Abdomen is soft.     Tenderness: There is no abdominal tenderness.  Musculoskeletal:        General: No swelling.     Cervical back: Normal range of motion and neck supple. No rigidity.  Skin:    General: Skin is warm and dry.     Capillary Refill: Capillary refill takes less than 2 seconds.     Coloration: Skin is not jaundiced.     Findings: Rash (dyshidrotic eczema to bilateral hands) present. No bruising or erythema.     Comments: Onychomycosis with partial nail loss to R great toe without evidence of infection. No swelling, erythema, or drainage  Neurological:      General: No focal deficit present.     Mental Status: She is alert.  Psychiatric:        Mood and Affect: Mood normal.     UC Treatments / Results  Labs (all labs ordered are listed, but only abnormal results are displayed) Labs Reviewed - No data to display  EKG   Radiology No results found.  Procedures Procedures (including critical care time)  Medications Ordered in UC Medications - No data to display  Initial Impression / Assessment and Plan / UC Course  I have reviewed the triage vital signs and the nursing notes.  Pertinent labs & imaging results that were available during my care of the patient were reviewed by me and considered in my medical decision making (see chart for details).     Dyshidrotic eczema -topical steroid cream twice daily for up to 14 days.  Do not occlude.  Limit glove wearing and handwashing. Onychomycosis -purchase over-the-counter ciclopirox nail polish.  Should symptoms persist or worsen, follow-up with your PCP to see if terbinafine may be effective.  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 This can be due to excessive handwashing or glove wearing. Please try to limit both if possible. Use the topical triamcinolone ointment twice daily.  Do not occlude and do not exceed greater than 14 days of use. Regarding your toenail, you can purchase over-the-counter ciclopirox which is an antifungal nail treatment.     ED Prescriptions     Medication Sig Dispense Auth. Provider   triamcinolone ointment (KENALOG) 0.5 % Apply 1 application topically 2 (two) times daily. 30 g Miyo Aina L, [place]      PDMP not reviewed this encounter.   Maretta Bees, [place] 09/03/21 1552

## 2021-09-03 NOTE — ED Triage Notes (Signed)
Pt presents with rash on both hands that is very itchy X 1 month that is unrelieved with OTC remedies.

## 2021-10-09 IMAGING — CR DG FOOT COMPLETE 3+V*L*
3 series · 3 of 3 positions shown · non-contrast
Comparison: None.

CLINICAL DATA: Pt reported left foot pain that radiates into all of
her toes after dropping a car part on it yesterday at work.l foot pn

EXAM:
LEFT FOOT - COMPLETE 3+ VIEW

[x foot ap left]
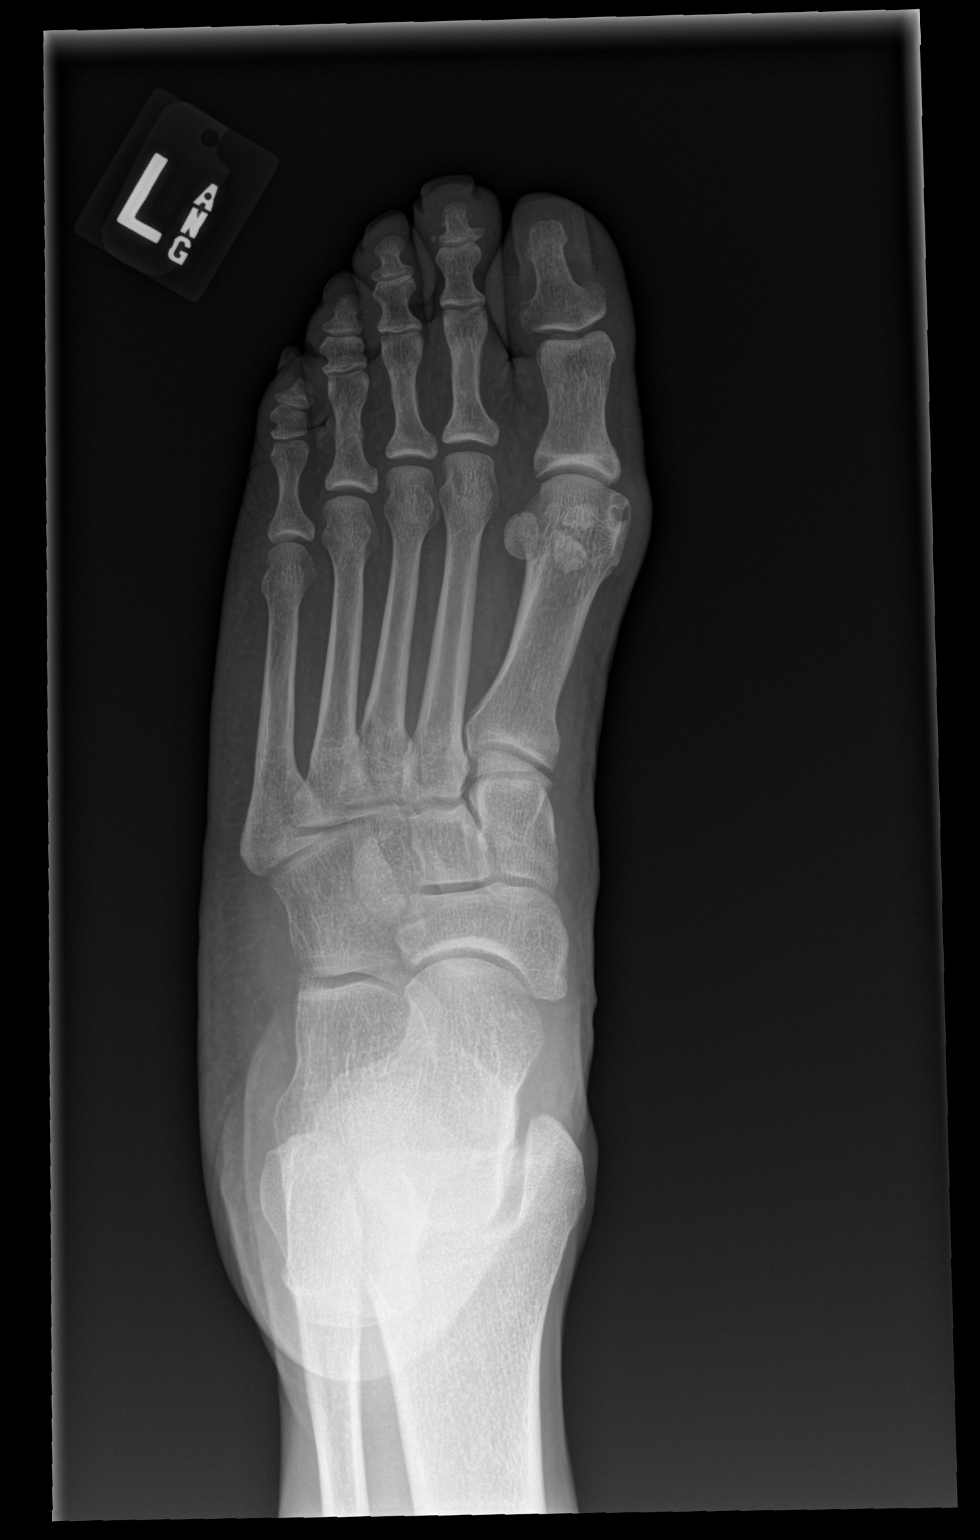

[x foot obl left]
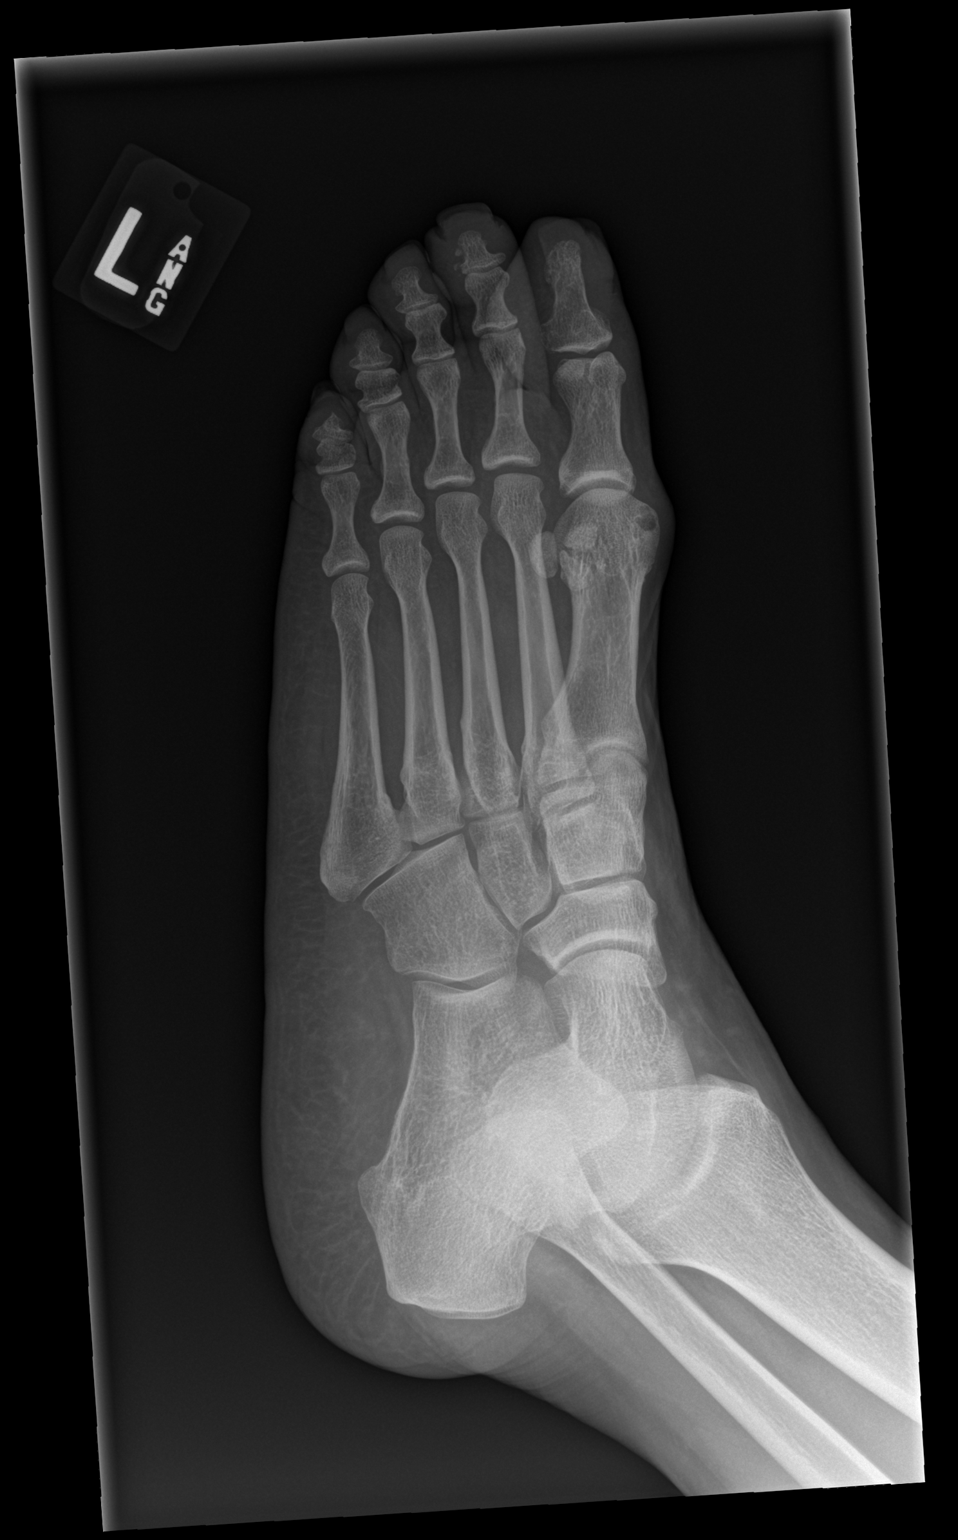

[x foot lat left]
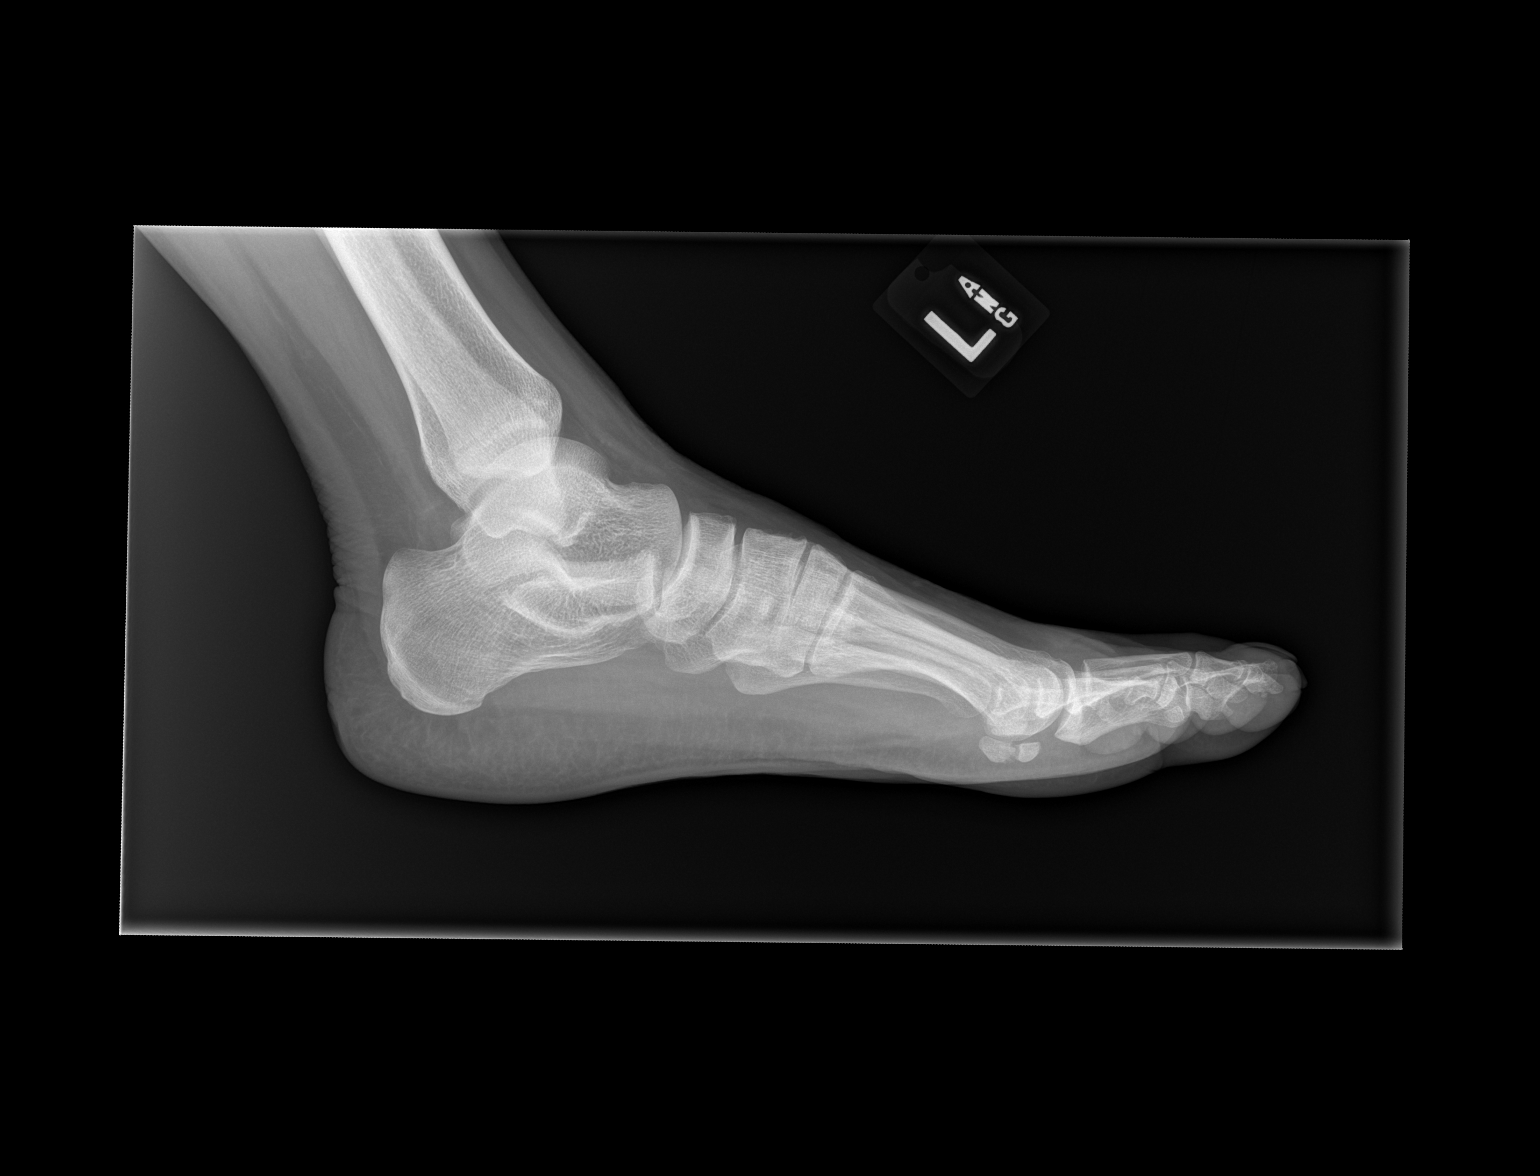

[3 of 3 positions shown; findings below may reference images not displayed]

FINDINGS: No fracture or dislocation of mid foot or forefoot. The phalanges
are normal. The calcaneus is normal. No soft tissue abnormality.
IMPRESSION: No fracture or dislocation.

## 2022-09-04 ENCOUNTER — Ambulatory Visit (HOSPITAL_COMMUNITY)
Admission: EM | Admit: 2022-09-04 | Discharge: 2022-09-04 | Disposition: A | Discharge status: Home / Self Care | Payer: Commercial Managed Care - HMO | Attending: Physician Assistant | Admitting: Physician Assistant

## 2022-09-04 ENCOUNTER — Encounter (HOSPITAL_COMMUNITY): Payer: Self-pay

## 2022-09-04 ENCOUNTER — Ambulatory Visit (INDEPENDENT_AMBULATORY_CARE_PROVIDER_SITE_OTHER): Payer: Commercial Managed Care - HMO

## 2022-09-04 DIAGNOSIS — S93402A Sprain of unspecified ligament of left ankle, initial encounter: Secondary | ICD-10-CM

## 2022-09-04 DIAGNOSIS — S93602A Unspecified sprain of left foot, initial encounter: Secondary | ICD-10-CM

## 2022-09-04 DIAGNOSIS — M25572 Pain in left ankle and joints of left foot: Secondary | ICD-10-CM

## 2022-09-04 MED ORDER — NAPROXEN 500 MG PO TABS: 500.0000 mg | ORAL_TABLET | Freq: Two times a day (BID) | ORAL | 0 refills | Status: AC

## 2022-09-04 NOTE — ED Provider Notes (Signed)
[facility]    CSN: [id number] Arrival date & time: 09/04/22  [id number]      History   Chief Complaint Chief Complaint  Patient presents with   Foot Pain    HPI Jessica Golden is a 52 y.o. female.   Patient presents today with a 2-week history of left ankle and foot pain.  She denies any known injury or increase in activity prior to symptom onset.  Reports that gradually this pain has been worsening and now she is having difficulty bearing weight.  She reports that at rest there is minimal pain but it becomes severe if she tries to bear weight or attempt to ambulate.  She denies any numbness or paresthesias.  She has tried ibuprofen with temporary improvement of symptoms.  Denies previous injury or surgery involving her ankle or foot.  She denies history of diabetes.    History reviewed. No pertinent past medical history.  There are no problems to display for this patient.   History reviewed. No pertinent surgical history.  OB History   No obstetric history on file.      Home Medications    Prior to Admission medications   Medication Sig Start Date End Date Taking? Authorizing Provider  naproxen (NAPROSYN) 500 MG tablet Take 1 tablet (500 mg total) by mouth 2 (two) times daily. 09/04/22  Yes Yerlin Gasparyan, Derry Skill, PA-C    Family History Family History  Family history unknown: Yes    Social History Social History   Tobacco Use   Smoking status: Every Day   Smokeless tobacco: Never  Substance Use Topics   Alcohol use: No   Drug use: No     Allergies   Patient has no known allergies.   Review of Systems Review of Systems  Constitutional:  Positive for activity change. Negative for appetite change, fatigue and fever.  Gastrointestinal:  Negative for abdominal pain, diarrhea, nausea and vomiting.  Musculoskeletal:  Positive for arthralgias, gait problem and joint swelling. Negative for myalgias.  Neurological:  Negative for weakness and numbness.      Physical Exam Triage Vital Signs ED Triage Vitals  Enc Vitals Group     BP 09/04/22 1223 115/66     Pulse Rate 09/04/22 1223 85     Resp 09/04/22 1223 12     Temp 09/04/22 1223 98 F (36.7 C)     Temp Source 09/04/22 1223 Oral     SpO2 09/04/22 1223 100 %     Weight --      Height --      Head Circumference --      Peak Flow --      Pain Score 09/04/22 1221 7     Pain Loc --      Pain Edu? --      Excl. in [place]? --    No data found.  Updated Vital Signs BP 115/66 (BP Location: Left Arm)   Pulse 85   Temp 98 F (36.7 C) (Oral)   Resp 12   LMP 08/26/2016   SpO2 100%   Visual Acuity Right Eye Distance:   Left Eye Distance:   Bilateral Distance:    Right Eye Near:   Left Eye Near:    Bilateral Near:     Physical Exam Vitals reviewed.  Constitutional:      General: She is awake. She is not in acute distress.    Appearance: Normal appearance. She is well-developed. She is not ill-appearing.  Comments: Very pleasant female appears stated age in no acute distress sitting comfortably in exam room  HENT:     Head: Normocephalic and atraumatic.  Cardiovascular:     Rate and Rhythm: Normal rate and regular rhythm.     Heart sounds: Normal heart sounds, S1 normal and S2 normal. No murmur heard.    Comments: 1+ pitting edema at ankle on left.  Capillary refill within 2 seconds left toes Pulmonary:     Effort: Pulmonary effort is normal.     Breath sounds: Normal breath sounds. No wheezing, rhonchi or rales.     Comments: Clear to auscultation bilaterally Abdominal:     Palpations: Abdomen is soft.     Tenderness: There is no abdominal tenderness.  Musculoskeletal:     Right lower leg: No edema.     Left lower leg: 1+ Edema present.     Left ankle: Swelling present. Tenderness present over the medial malleolus. No lateral malleolus tenderness. Normal range of motion.     Left foot: Normal range of motion. Tenderness present. No deformity, bunion or bony  tenderness.     Comments: Ankle/foot: Tender palpation over medial malleoli and along medial foot.  No deformity noted.  Foot neurovascularly intact.  Negative Homans' sign.  Feet:     Left foot:     Protective Sensation: 10 sites tested.  10 sites sensed.     Skin integrity: Dry skin present. No ulcer, blister or skin breakdown.     Toenail Condition: Left toenails are abnormally thick.  Psychiatric:        Behavior: Behavior is cooperative.      UC Treatments / Results  Labs (all labs ordered are listed, but only abnormal results are displayed) Labs Reviewed - No data to display  EKG   Radiology DG Ankle Complete Left  Result Date: 09/04/2022 CLINICAL DATA:  swelling and pain x 2 weeks EXAM: LEFT ANKLE COMPLETE - 3 VIEW; LEFT FOOT - COMPLETE 3 VIEW COMPARISON:  None Available. FINDINGS: There is no evidence of fracture, dislocation, or joint effusion. There is no evidence of arthropathy or other focal bone abnormality. Soft tissue swelling noted at the lateral malleolus. IMPRESSION: Soft tissue swelling. No acute osseous abnormality identified. Electronically Signed   By: Sammie Bench M.D.   On: 09/04/2022 13:06   DG Foot Complete Left  Result Date: 09/04/2022 CLINICAL DATA:  swelling and pain x 2 weeks EXAM: LEFT ANKLE COMPLETE - 3 VIEW; LEFT FOOT - COMPLETE 3 VIEW COMPARISON:  None Available. FINDINGS: There is no evidence of fracture, dislocation, or joint effusion. There is no evidence of arthropathy or other focal bone abnormality. Soft tissue swelling noted at the lateral malleolus. IMPRESSION: Soft tissue swelling. No acute osseous abnormality identified. Electronically Signed   By: Sammie Bench M.D.   On: 09/04/2022 13:06    Procedures Procedures (including critical care time)  Medications Ordered in UC Medications - No data to display  Initial Impression / Assessment and Plan / UC Course  I have reviewed the triage vital signs and the nursing  notes.  Pertinent labs & imaging results that were available during my care of the patient were reviewed by me and considered in my medical decision making (see chart for details).     Patient is well-appearing, afebrile, nontoxic, nontachycardic.  X-ray of the foot and ankle was obtained given tenderness which showed no acute osseous abnormality.  Suspect sprain as etiology of symptoms.  She was encouraged to use  conservative treatment measures including RICE protocol.  Recommend that she keep her leg elevated as much as she can for the next several days ideally above the level of her heart.  She was started on Naprosyn to help with pain management.  Discussed that she is not to take additional NSAIDs with this medication due to risk of GI bleeding.  Can use acetaminophen/Tylenol for breakthrough pain.  Given symptoms are interfering with her ability to perform daily duties including ambulate recommend that she follow-up with podiatry; she was given contact information for local provider with instruction to call to schedule an appointment.  Discussed that if she has any worsening symptoms including increasing pain, numbness, paresthesias she should be seen immediately.  Strict return precautions given.  Work excuse note provided.  Final Clinical Impressions(s) / UC Diagnoses   Final diagnoses:  Foot sprain, left, initial encounter  Sprain of left ankle, unspecified ligament, initial encounter     Discharge Instructions      Your x-ray was normal with no evidence of a fracture/broken bone.  I suspect that you have injured some of the soft tissue in your foot/ankle.  Please rest and use compression, ice, elevation to help manage her symptoms.  I have called in Naprosyn twice daily to help with your pain.  Do not take NSAIDs with this medication including aspirin, ibuprofen/Advil, naproxen/Aleve.  If your symptoms or not improving quickly I recommend you follow-up with podiatry; call to schedule an  appointment.  If anything worsens you should be seen immediately.     ED Prescriptions     Medication Sig Dispense Auth. Provider   naproxen (NAPROSYN) 500 MG tablet Take 1 tablet (500 mg total) by mouth 2 (two) times daily. 30 tablet Tyeler Goedken, Derry Skill, PA-C      PDMP not reviewed this encounter.   Terrilee Croak, PA-C 09/04/22 1330

## 2022-09-04 NOTE — Discharge Instructions (Signed)
Your x-ray was normal with no evidence of a fracture/broken bone.  I suspect that you have injured some of the soft tissue in your foot/ankle.  Please rest and use compression, ice, elevation to help manage her symptoms.  I have called in Naprosyn twice daily to help with your pain.  Do not take NSAIDs with this medication including aspirin, ibuprofen/Advil, naproxen/Aleve.  If your symptoms or not improving quickly I recommend you follow-up with podiatry; call to schedule an appointment.  If anything worsens you should be seen immediately.

## 2022-09-04 NOTE — ED Triage Notes (Signed)
P tis here for foot pain possibly from a past injury causing pain and swelling x 2wks

## 2023-03-29 DIAGNOSIS — R059 Cough, unspecified: Secondary | ICD-10-CM | POA: Diagnosis not present

## 2024-03-04 DIAGNOSIS — Z6826 Body mass index (BMI) 26.0-26.9, adult: Secondary | ICD-10-CM | POA: Diagnosis not present

## 2024-03-04 DIAGNOSIS — Z1159 Encounter for screening for other viral diseases: Secondary | ICD-10-CM | POA: Diagnosis not present

## 2024-03-04 DIAGNOSIS — Z1211 Encounter for screening for malignant neoplasm of colon: Secondary | ICD-10-CM | POA: Diagnosis not present

## 2024-03-04 DIAGNOSIS — Z0001 Encounter for general adult medical examination with abnormal findings: Secondary | ICD-10-CM | POA: Diagnosis not present

## 2024-03-04 DIAGNOSIS — E559 Vitamin D deficiency, unspecified: Secondary | ICD-10-CM | POA: Diagnosis not present

## 2024-03-04 DIAGNOSIS — Z13220 Encounter for screening for lipoid disorders: Secondary | ICD-10-CM | POA: Diagnosis not present

## 2024-03-04 DIAGNOSIS — Z131 Encounter for screening for diabetes mellitus: Secondary | ICD-10-CM | POA: Diagnosis not present

## 2024-03-04 DIAGNOSIS — Z1329 Encounter for screening for other suspected endocrine disorder: Secondary | ICD-10-CM | POA: Diagnosis not present

## 2024-03-04 DIAGNOSIS — R7989 Other specified abnormal findings of blood chemistry: Secondary | ICD-10-CM | POA: Diagnosis not present

## 2024-06-24 DIAGNOSIS — Z1211 Encounter for screening for malignant neoplasm of colon: Secondary | ICD-10-CM | POA: Diagnosis not present

## 2024-06-25 DIAGNOSIS — R051 Acute cough: Secondary | ICD-10-CM | POA: Diagnosis not present

## 2024-06-25 DIAGNOSIS — B351 Tinea unguium: Secondary | ICD-10-CM | POA: Diagnosis not present

## 2024-06-25 DIAGNOSIS — E559 Vitamin D deficiency, unspecified: Secondary | ICD-10-CM | POA: Diagnosis not present

## 2024-06-25 DIAGNOSIS — J4541 Moderate persistent asthma with (acute) exacerbation: Secondary | ICD-10-CM | POA: Diagnosis not present
